# Patient Record
Sex: Female | Born: 1985 | Race: White | Hispanic: No | Marital: Single | Smoking: Never smoker
Health system: Southern US, Community
[De-identification: ages and names within clinical notes are randomized; demographics above are authoritative.]

## PROBLEM LIST (undated history)

## (undated) DIAGNOSIS — J45909 Unspecified asthma, uncomplicated: Secondary | ICD-10-CM

## (undated) DIAGNOSIS — O24419 Gestational diabetes mellitus in pregnancy, unspecified control: Secondary | ICD-10-CM

## (undated) HISTORY — PX: NO PAST SURGERIES: SHX2092

---

## 2006-05-03 ENCOUNTER — Emergency Department: Payer: Self-pay | Admitting: Emergency Medicine

## 2011-09-14 ENCOUNTER — Emergency Department: Payer: Self-pay | Admitting: *Deleted

## 2012-09-07 IMAGING — CR RIGHT HAND - COMPLETE 3+ VIEW
1 series · 3 of 3 positions shown · non-contrast
Comparison: none

REASON FOR EXAM: trauma
COMMENTS:

[Series 1: view not recorded · 0.17mm/px · 3 of 3 slices shown]
[im 1/3]
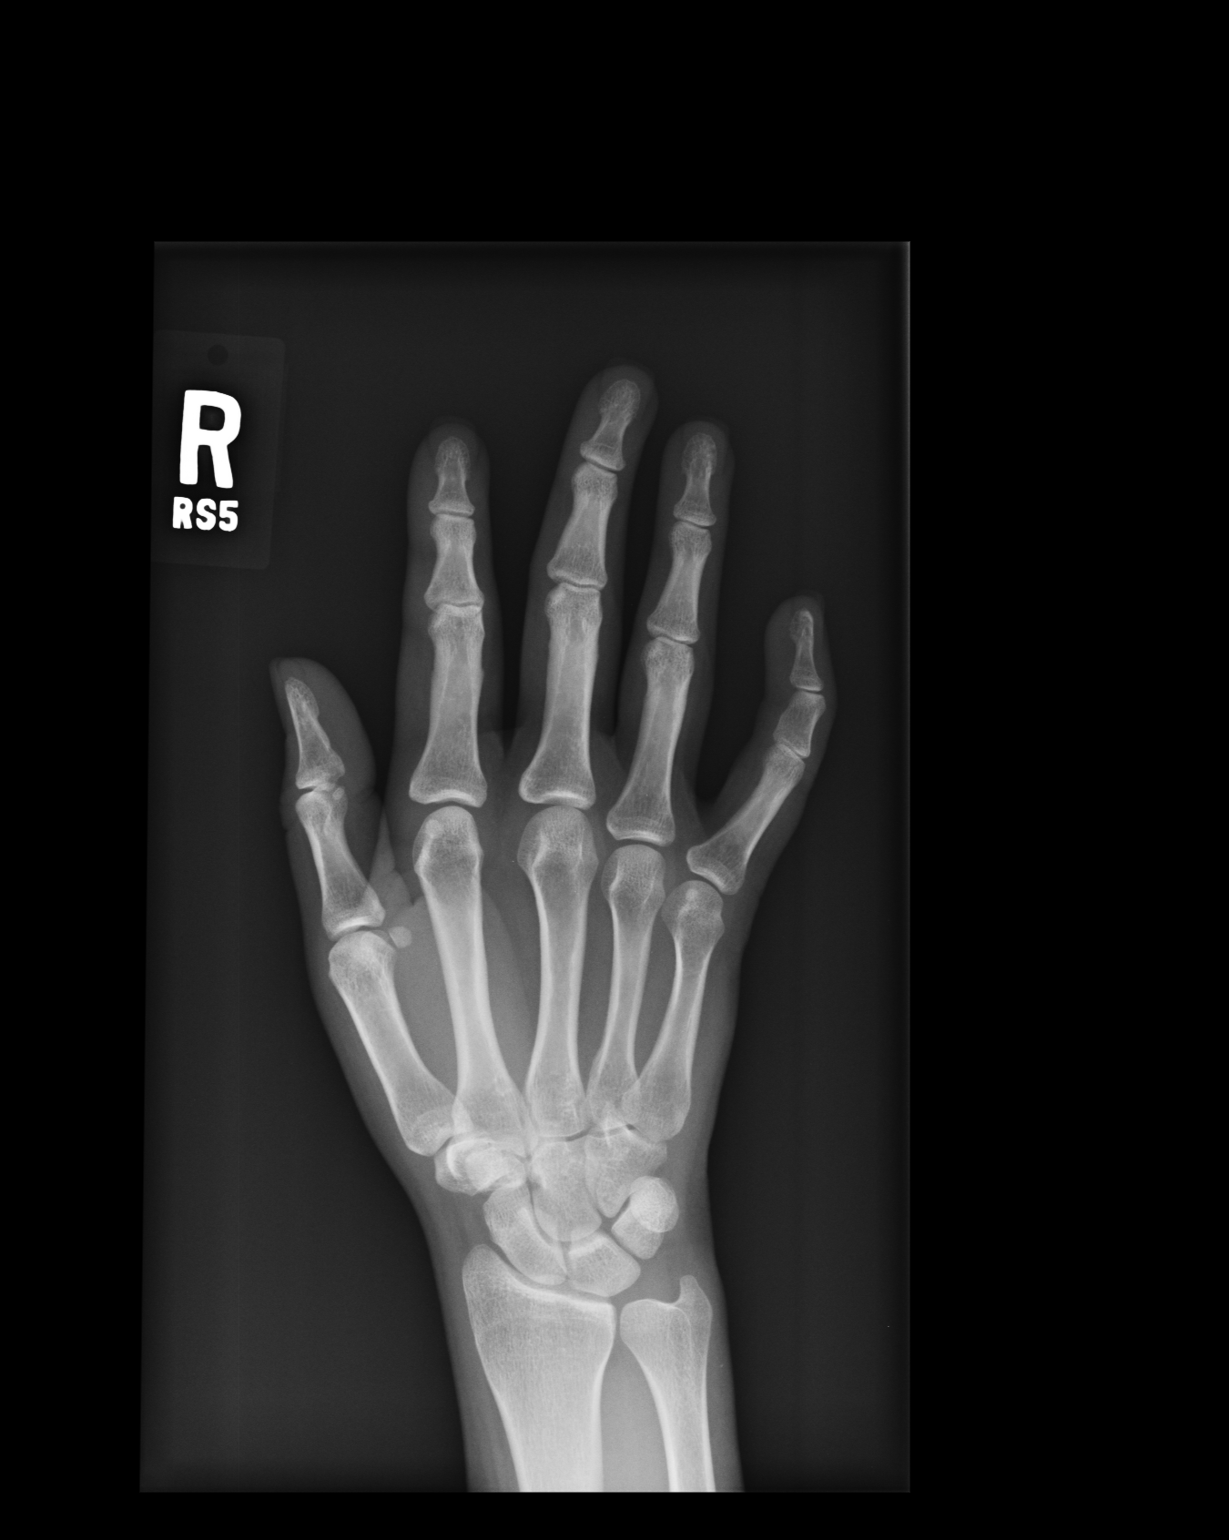
[im 2/3]
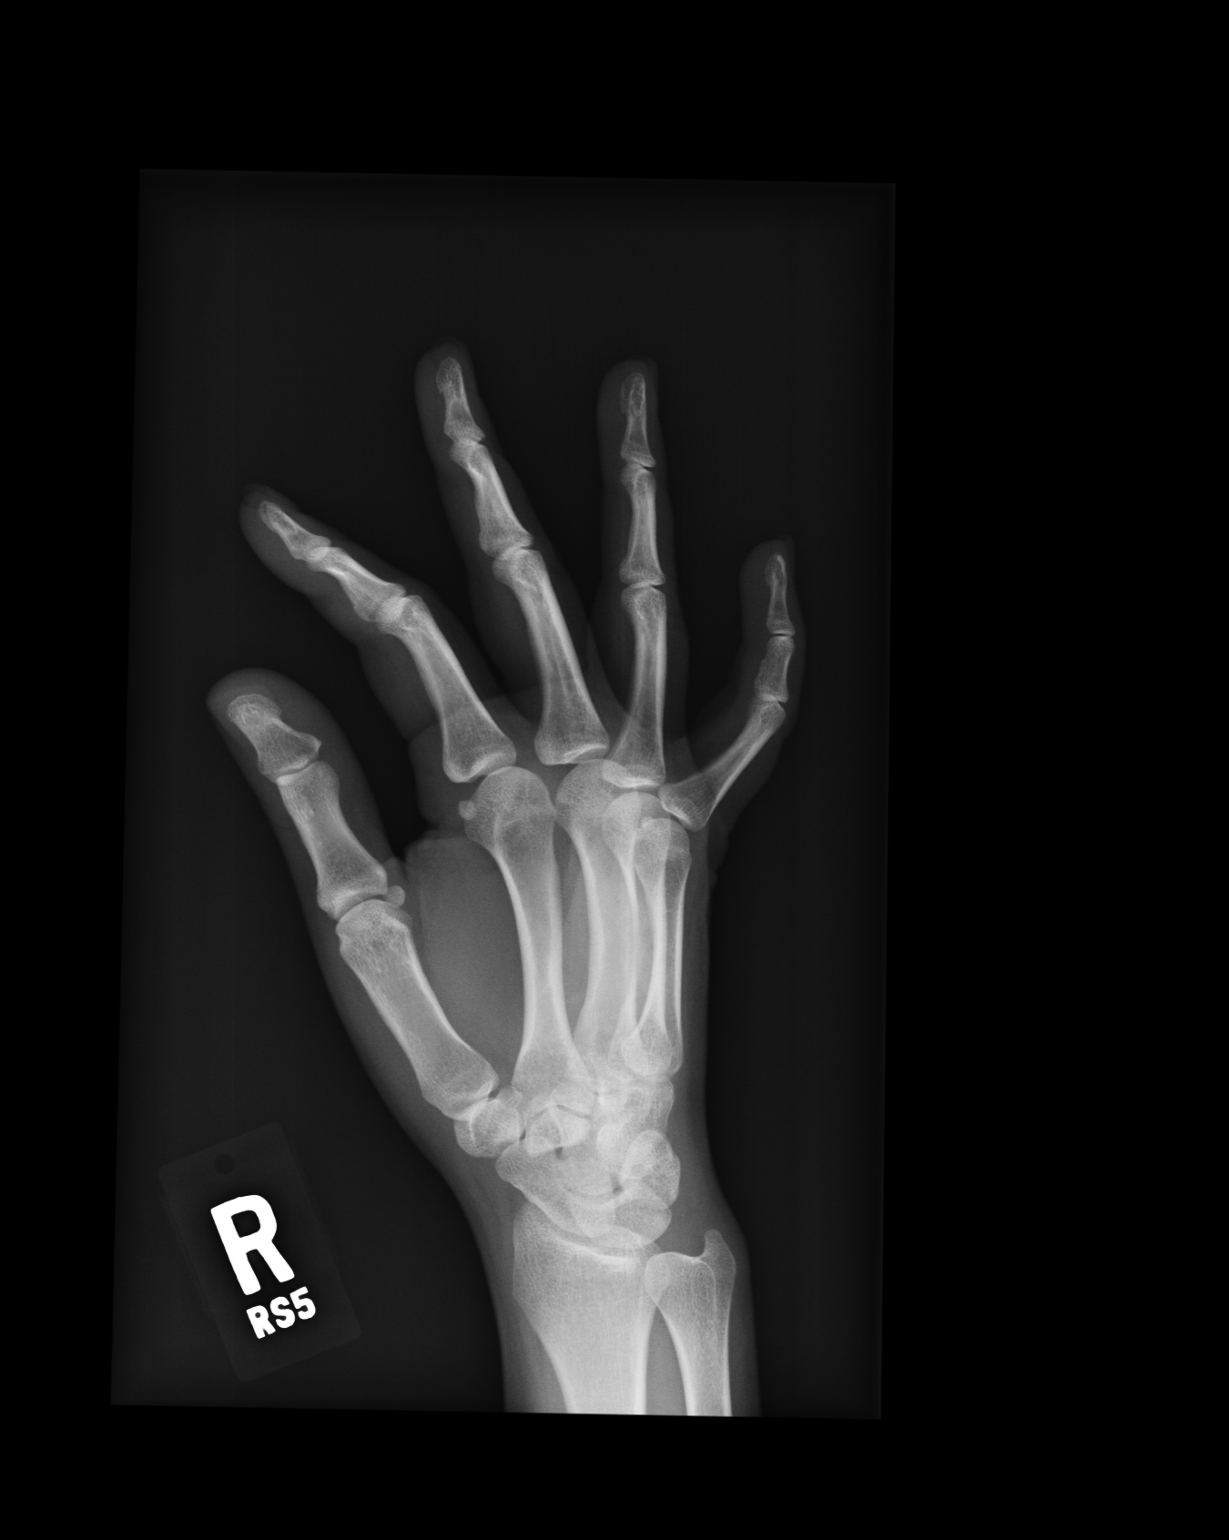
[im 3/3]
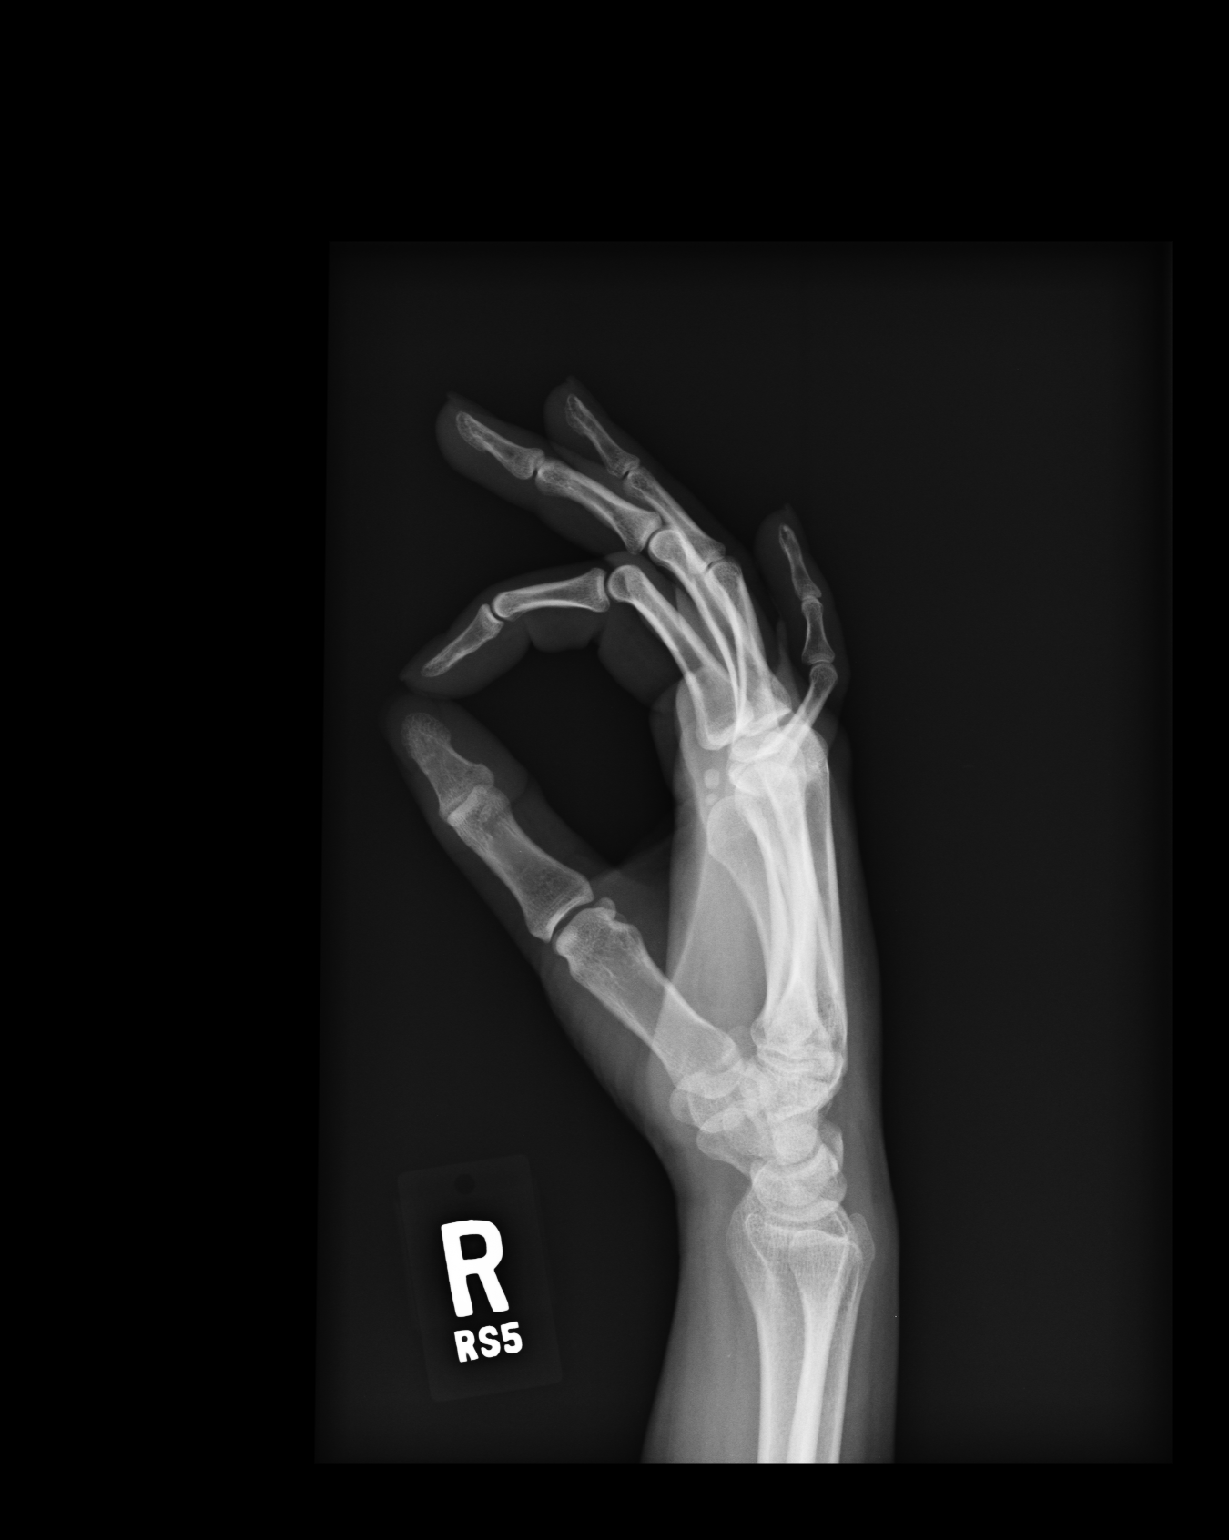

[3 of 3 positions shown; findings below may reference images not displayed]

PROCEDURE:     DXR - DXR HAND RT COMPLETE W/OBLIQUES  - September 14, 2011  [DATE]

RESULT:     No fracture is identified. The proximal phalanx of the fifth
finger is held in extension with respect to the fifth metacarpal. No
associated fracture is seen. The possibility of partial dorsal subluxation
of the proximal phalanx with respect to the fifth metacarpal cannot be
totally excluded radiographically and correlation with clinical findings is
needed.
IMPRESSION: 1. No fracture is seen.
2. There is apparent hyperextension at the MP joint of the fifth finger.
This could be transient but correlation with clinical findings is needed to
exclude partial subluxation.

## 2012-09-07 IMAGING — CT CT CERVICAL SPINE WITHOUT CONTRAST
1 series · 12 of 14 positions shown, 15 images · non-contrast
Comparison: none

REASON FOR EXAM: Trauma, EtOH
COMMENTS:

[Series 3: axial · axial · 0.20mm/px · z∈[-226,-60]mm · 12 of 102 slices shown, 15 images]
[im 8/102  soft-tissue]
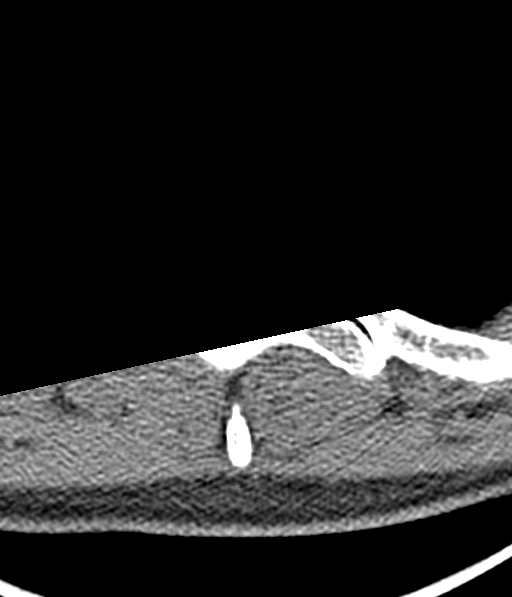
[im 8/102  bone]
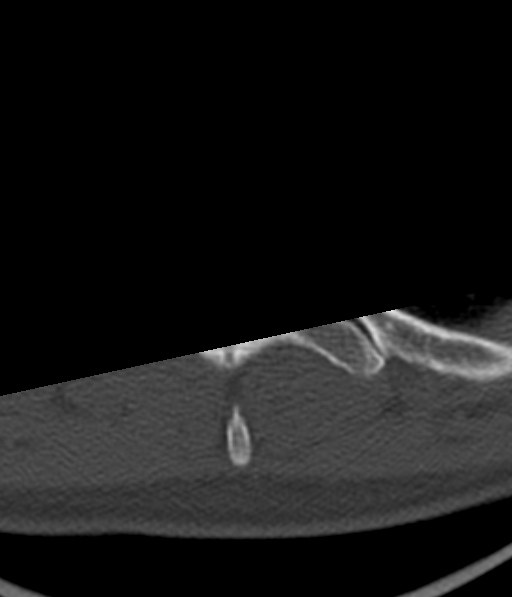
[im 16/102  bone]
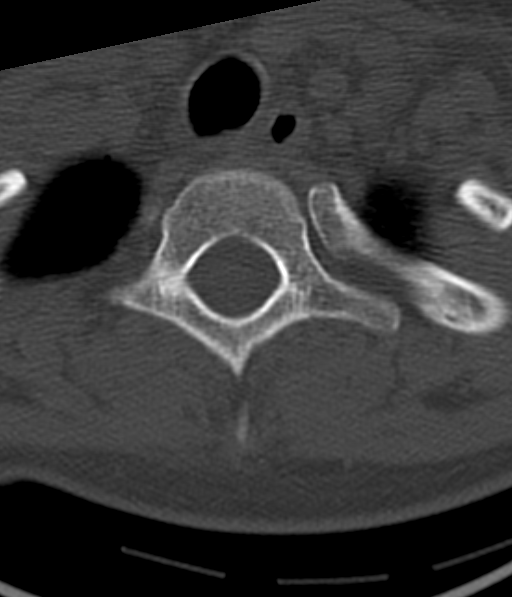
[im 24/102  bone]
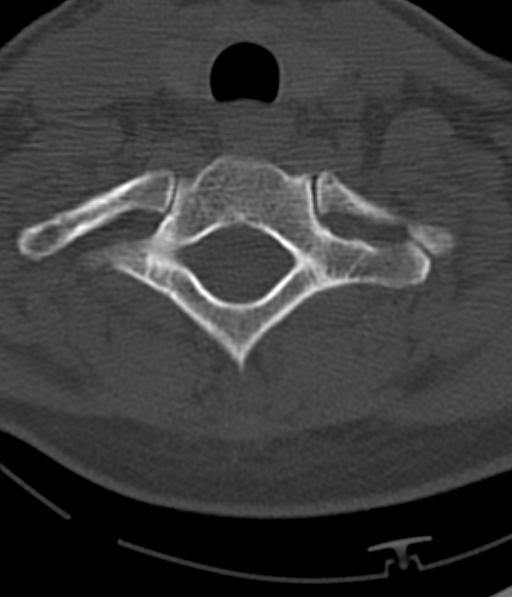
[im 32/102  bone]
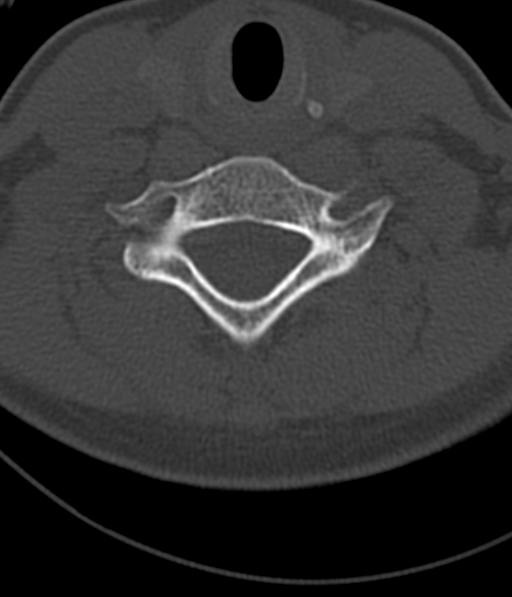
[im 39/102  soft-tissue]
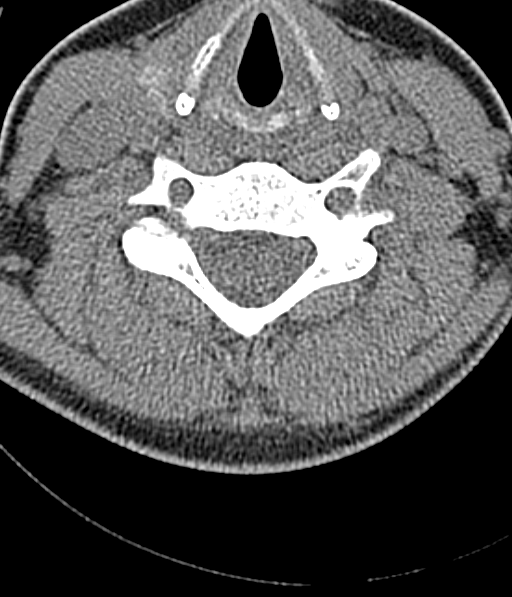
[im 39/102  bone]
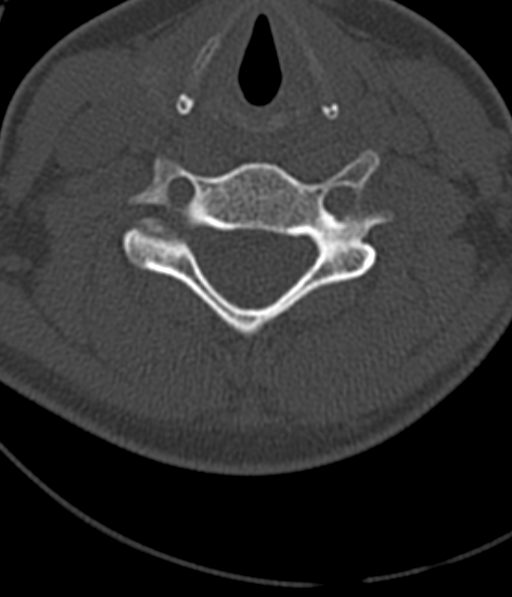
[im 47/102  bone]
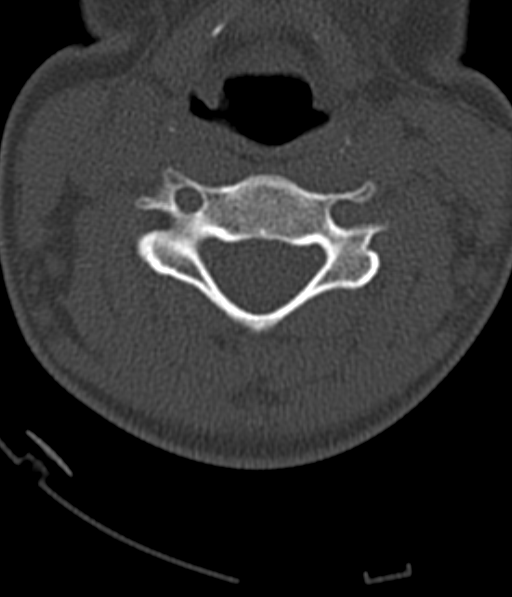
[im 55/102  bone]
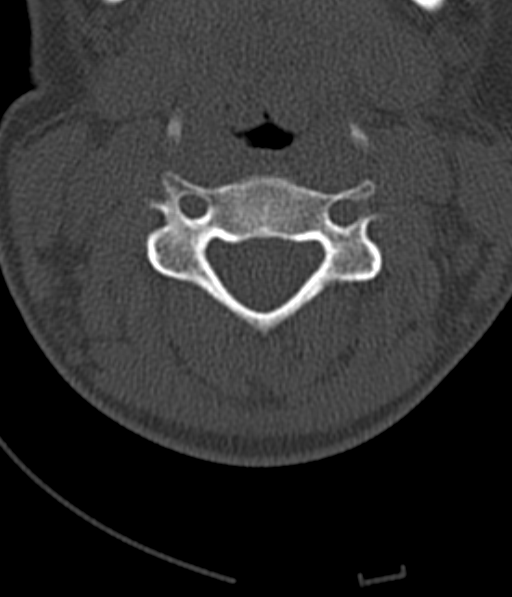
[im 63/102  bone]
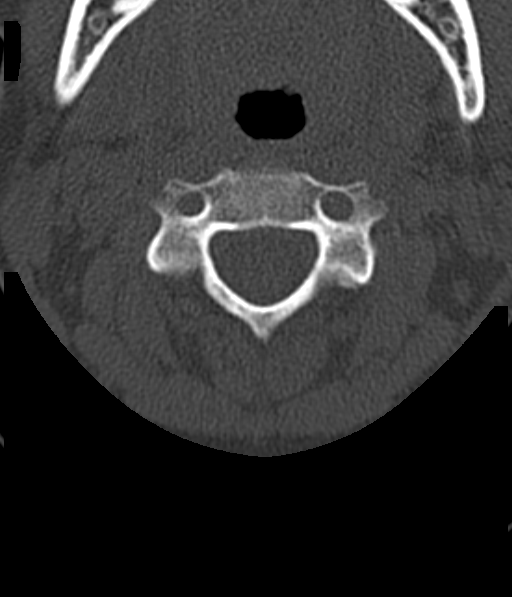
[im 70/102  soft-tissue]
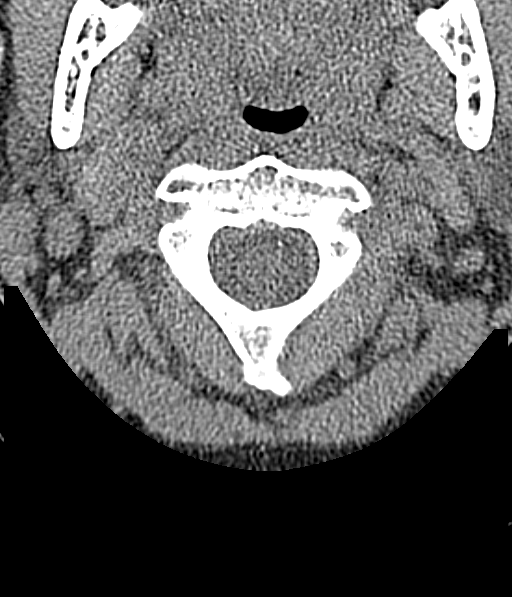
[im 70/102  bone]
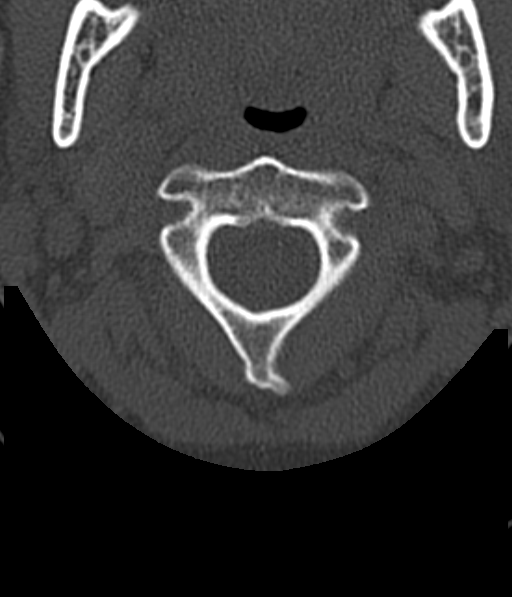
[im 78/102  bone]
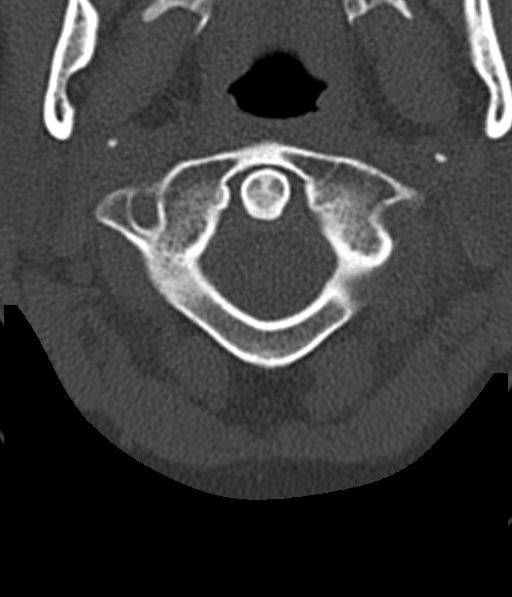
[im 86/102  bone]
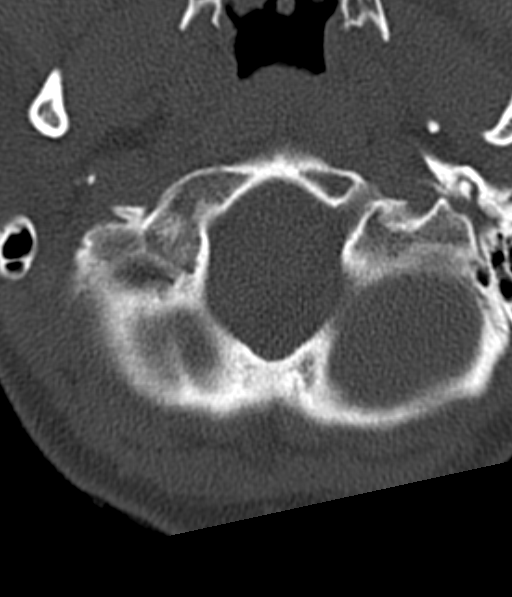
[im 94/102  bone]
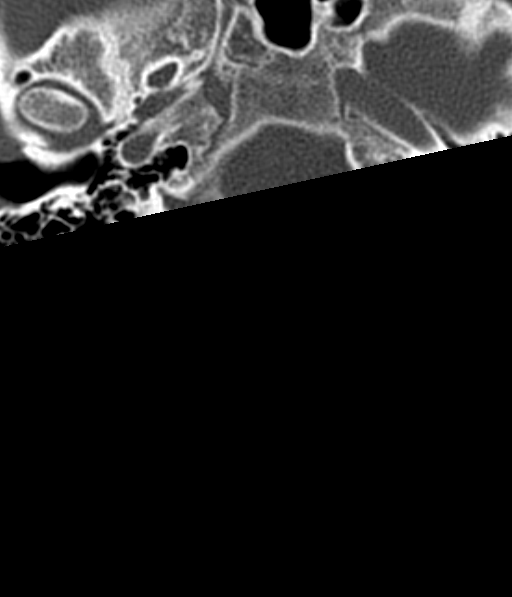

[12 of 14 positions shown; findings below may reference images not displayed]

PROCEDURE:     CT  - CT CERVICAL SPINE WO  - September 14, 2011  [DATE]

RESULT:     Sagittal, axial, and coronal images through the cervical spine
are reviewed.

There is mild reversal of the normal cervical lordosis likely due to muscle
spasm. The cervical vertebral bodies are preserved in height. The
intervertebral disc space heights are well-maintained. There is no evidence
of a jumped facet. The prevertebral soft tissue spaces appear normal. The
spinous processes are intact. The lateral masses of C1 align normally with
those of C2. The odontoid is intact. The bony ring at each cervical level is
intact.
IMPRESSION: 1. I do not see evidence of acute cervical spine fracture nor dislocation.
2. There is reversal of the normal cervical lordosis consistent with muscle
spasm.

## 2013-07-10 ENCOUNTER — Ambulatory Visit: Payer: Self-pay

## 2013-07-30 ENCOUNTER — Ambulatory Visit: Payer: Self-pay

## 2013-09-13 ENCOUNTER — Inpatient Hospital Stay: Payer: Self-pay | Admitting: Obstetrics and Gynecology

## 2013-09-13 LAB — CBC WITH DIFFERENTIAL/PLATELET
Eosinophil #: 0.1 10*3/uL (ref 0.0–0.7)
Eosinophil %: 1.1 %
HCT: 32.8 % — ABNORMAL LOW (ref 35.0–47.0)
HGB: 11.4 g/dL — ABNORMAL LOW (ref 12.0–16.0)
Monocyte %: 6.4 %
Neutrophil #: 7.4 10*3/uL — ABNORMAL HIGH (ref 1.4–6.5)
Neutrophil %: 74.5 %
Platelet: 113 10*3/uL — ABNORMAL LOW (ref 150–440)
RBC: 3.72 10*6/uL — ABNORMAL LOW (ref 3.80–5.20)
RDW: 14.4 % (ref 11.5–14.5)

## 2013-09-14 LAB — PLATELET COUNT: Platelet: 100 10*3/uL — ABNORMAL LOW (ref 150–440)

## 2013-11-01 ENCOUNTER — Ambulatory Visit: Payer: Self-pay | Admitting: Nurse Practitioner

## 2014-11-29 NOTE — L&D Delivery Note (Signed)
Delivery Note At 8:34 AM a viable female was delivered via Vaginal, Spontaneous Delivery (Presentation: LOA).  APGAR: 8, 9; weight  .   Placenta status: intact, Spontaneous.  Cord: 3 vessel with the following complications: nuchal cord x1.  Cord pH: n/a  Anesthesia:  epidrual Episiotomy:  none Lacerations:  none Suture Repair: n/a Est. Blood Loss (mL):  50cc  Mom to postpartum.  Baby to Couplet care / Skin to Skin.  Patient Spontneously ruptured her membranes as she was sitting for her epidural, and progressed quickly after that.  She pushed with two contractions and the baby was delivered via uterine power only.  Nuchal cord x1, reduced at perineum.  Baby was vigorous and placed on mom's chest for recovery with good cry.  Cord kept intact until pulseless and then doubly clamped and cut by FOB.  Placenta delivered spontaneously and intact.  Minimal blood loss.  Ward, Chelsea C 09/30/2015, 8:55 AM

## 2015-02-27 ENCOUNTER — Ambulatory Visit
Admit: 2015-02-27 | Disposition: A | Discharge status: Home / Self Care | Payer: Self-pay | Attending: Family Medicine | Admitting: Family Medicine

## 2015-09-30 ENCOUNTER — Inpatient Hospital Stay: Payer: Medicaid Other | Admitting: Certified Registered"

## 2015-09-30 ENCOUNTER — Other Ambulatory Visit: Payer: Self-pay | Admitting: Obstetrics and Gynecology

## 2015-09-30 ENCOUNTER — Encounter: Payer: Self-pay | Admitting: Certified Nurse Midwife

## 2015-09-30 ENCOUNTER — Inpatient Hospital Stay
Admission: EM | Admit: 2015-09-30 | Discharge: 2015-10-01 | DRG: 775 | Disposition: A | Discharge status: Home / Self Care | Payer: Medicaid Other | Attending: Obstetrics and Gynecology | Admitting: Obstetrics and Gynecology

## 2015-09-30 DIAGNOSIS — O9912 Other diseases of the blood and blood-forming organs and certain disorders involving the immune mechanism complicating childbirth: Principal | ICD-10-CM | POA: Diagnosis present

## 2015-09-30 DIAGNOSIS — Z8632 Personal history of gestational diabetes: Secondary | ICD-10-CM

## 2015-09-30 DIAGNOSIS — O99119 Other diseases of the blood and blood-forming organs and certain disorders involving the immune mechanism complicating pregnancy, unspecified trimester: Secondary | ICD-10-CM

## 2015-09-30 DIAGNOSIS — Z3A40 40 weeks gestation of pregnancy: Secondary | ICD-10-CM

## 2015-09-30 DIAGNOSIS — D696 Thrombocytopenia, unspecified: Secondary | ICD-10-CM | POA: Diagnosis present

## 2015-09-30 HISTORY — DX: Gestational diabetes mellitus in pregnancy, unspecified control: O24.419

## 2015-09-30 HISTORY — DX: Unspecified asthma, uncomplicated: J45.909

## 2015-09-30 LAB — CBC
HCT: 36.8 % (ref 35.0–47.0)
HEMATOCRIT: 37.7 % (ref 35.0–47.0)
HEMOGLOBIN: 13 g/dL (ref 12.0–16.0)
HEMOGLOBIN: 12.5 g/dL (ref 12.0–16.0)
MCH: 31 pg (ref 26.0–34.0)
MCH: 30.5 pg (ref 26.0–34.0)
MCHC: 34.6 g/dL (ref 32.0–36.0)
MCHC: 34 g/dL (ref 32.0–36.0)
MCV: 89.4 fL (ref 80.0–100.0)
MCV: 89.9 fL (ref 80.0–100.0)
PLATELETS: 114 10*3/uL — AB (ref 150–440)
Platelets: 112 10*3/uL — ABNORMAL LOW (ref 150–440)
RBC: 4.21 MIL/uL (ref 3.80–5.20)
RBC: 4.09 MIL/uL (ref 3.80–5.20)
RDW: 14.3 % (ref 11.5–14.5)
RDW: 14.3 % (ref 11.5–14.5)
WBC: 9.6 10*3/uL (ref 3.6–11.0)
WBC: 14.6 10*3/uL — AB (ref 3.6–11.0)

## 2015-09-30 LAB — TYPE AND SCREEN
ABO/RH(D): O POS
Antibody Screen: NEGATIVE

## 2015-09-30 MED ORDER — SODIUM CHLORIDE 0.9 % IJ SOLN: 3.0000 mL | INTRAMUSCULAR | Status: DC | PRN

## 2015-09-30 MED ORDER — FENTANYL 2.5 MCG/ML W/ROPIVACAINE 0.2% IN NS 100 ML EPIDURAL INFUSION (ARMC-ANES): 9.0000 mL/h | EPIDURAL | Status: DC

## 2015-09-30 MED ORDER — MISOPROSTOL 200 MCG PO TABS
ORAL_TABLET | ORAL | Status: AC
  Filled 2015-09-30: qty 4

## 2015-09-30 MED ORDER — LANOLIN HYDROUS EX OINT: TOPICAL_OINTMENT | CUTANEOUS | Status: DC | PRN

## 2015-09-30 MED ORDER — SIMETHICONE 80 MG PO CHEW: 80.0000 mg | CHEWABLE_TABLET | ORAL | Status: DC | PRN

## 2015-09-30 MED ORDER — DIPHENHYDRAMINE HCL 25 MG PO CAPS: 25.0000 mg | ORAL_CAPSULE | ORAL | Status: DC | PRN

## 2015-09-30 MED ORDER — ONDANSETRON HCL 4 MG/2ML IJ SOLN: 4.0000 mg | Freq: Three times a day (TID) | INTRAMUSCULAR | Status: DC | PRN

## 2015-09-30 MED ORDER — LIDOCAINE HCL (PF) 1 % IJ SOLN
30.0000 mL | INTRAMUSCULAR | Status: DC | PRN
  Filled 2015-09-30: qty 30

## 2015-09-30 MED ORDER — BUTORPHANOL TARTRATE 1 MG/ML IJ SOLN
2.0000 mg | INTRAMUSCULAR | Status: DC | PRN
  Administered 2015-09-30: 1 mg via INTRAVENOUS

## 2015-09-30 MED ORDER — ONDANSETRON HCL 4 MG/2ML IJ SOLN: 4.0000 mg | INTRAMUSCULAR | Status: DC | PRN

## 2015-09-30 MED ORDER — LIDOCAINE-EPINEPHRINE (PF) 1.5 %-1:200000 IJ SOLN
INTRAMUSCULAR | Status: DC | PRN
  Administered 2015-09-30: 3 mL via EPIDURAL

## 2015-09-30 MED ORDER — BUTORPHANOL TARTRATE 1 MG/ML IJ SOLN
INTRAMUSCULAR | Status: AC
  Administered 2015-09-30: 1 mg via INTRAVENOUS
  Filled 2015-09-30: qty 1

## 2015-09-30 MED ORDER — AMMONIA AROMATIC IN INHA: 0.3000 mL | Freq: Once | RESPIRATORY_TRACT | Status: DC | PRN

## 2015-09-30 MED ORDER — DOCUSATE SODIUM 100 MG PO CAPS
100.0000 mg | ORAL_CAPSULE | Freq: Two times a day (BID) | ORAL | Status: DC
Start: 2015-09-30 — End: 2015-10-01
  Administered 2015-09-30 – 2015-10-01 (×2): 100 mg via ORAL
  Filled 2015-09-30 (×2): qty 1

## 2015-09-30 MED ORDER — DIPHENHYDRAMINE HCL 25 MG PO CAPS: 25.0000 mg | ORAL_CAPSULE | Freq: Four times a day (QID) | ORAL | Status: DC | PRN

## 2015-09-30 MED ORDER — FENTANYL 2.5 MCG/ML W/ROPIVACAINE 0.2% IN NS 100 ML EPIDURAL INFUSION (ARMC-ANES)
EPIDURAL | Status: AC
  Administered 2015-09-30: 9 mL/h via EPIDURAL
  Filled 2015-09-30: qty 100

## 2015-09-30 MED ORDER — MEPERIDINE HCL 25 MG/ML IJ SOLN: 6.2500 mg | INTRAMUSCULAR | Status: DC | PRN

## 2015-09-30 MED ORDER — ONDANSETRON HCL 4 MG/2ML IJ SOLN: 4.0000 mg | Freq: Four times a day (QID) | INTRAMUSCULAR | Status: DC | PRN

## 2015-09-30 MED ORDER — NALBUPHINE HCL 10 MG/ML IJ SOLN
5.0000 mg | Freq: Once | INTRAMUSCULAR | Status: DC | PRN
  Filled 2015-09-30: qty 0.5

## 2015-09-30 MED ORDER — ACETAMINOPHEN 325 MG PO TABS: 650.0000 mg | ORAL_TABLET | ORAL | Status: DC | PRN

## 2015-09-30 MED ORDER — NALOXONE HCL 2 MG/2ML IJ SOSY
1.0000 ug/kg/h | PREFILLED_SYRINGE | INTRAVENOUS | Status: DC | PRN
  Filled 2015-09-30: qty 2

## 2015-09-30 MED ORDER — OXYCODONE-ACETAMINOPHEN 5-325 MG PO TABS
1.0000 | ORAL_TABLET | ORAL | Status: DC | PRN
  Administered 2015-09-30 – 2015-10-01 (×5): 1 via ORAL
  Filled 2015-09-30 (×6): qty 1

## 2015-09-30 MED ORDER — KETOROLAC TROMETHAMINE 30 MG/ML IJ SOLN: 30.0000 mg | Freq: Four times a day (QID) | INTRAMUSCULAR | Status: DC | PRN

## 2015-09-30 MED ORDER — NALBUPHINE HCL 10 MG/ML IJ SOLN
5.0000 mg | INTRAMUSCULAR | Status: DC | PRN
  Filled 2015-09-30: qty 0.5

## 2015-09-30 MED ORDER — TETANUS-DIPHTH-ACELL PERTUSSIS 5-2.5-18.5 LF-MCG/0.5 IM SUSP: 0.5000 mL | Freq: Once | INTRAMUSCULAR | Status: DC

## 2015-09-30 MED ORDER — BUPIVACAINE HCL (PF) 0.25 % IJ SOLN
INTRAMUSCULAR | Status: DC | PRN
  Administered 2015-09-30: 5 mL via EPIDURAL

## 2015-09-30 MED ORDER — LIDOCAINE HCL (PF) 1 % IJ SOLN
INTRAMUSCULAR | Status: AC
  Filled 2015-09-30: qty 30

## 2015-09-30 MED ORDER — ONDANSETRON HCL 4 MG PO TABS: 4.0000 mg | ORAL_TABLET | ORAL | Status: DC | PRN

## 2015-09-30 MED ORDER — WITCH HAZEL-GLYCERIN EX PADS: 1.0000 "application " | MEDICATED_PAD | CUTANEOUS | Status: DC | PRN

## 2015-09-30 MED ORDER — OXYTOCIN 40 UNITS IN LACTATED RINGERS INFUSION - SIMPLE MED: 62.5000 mL/h | INTRAVENOUS | Status: DC | PRN

## 2015-09-30 MED ORDER — AMMONIA AROMATIC IN INHA
RESPIRATORY_TRACT | Status: AC
  Filled 2015-09-30: qty 10

## 2015-09-30 MED ORDER — IBUPROFEN 600 MG PO TABS
600.0000 mg | ORAL_TABLET | Freq: Four times a day (QID) | ORAL | Status: DC
  Administered 2015-09-30 – 2015-10-01 (×4): 600 mg via ORAL
  Filled 2015-09-30 (×5): qty 1

## 2015-09-30 MED ORDER — INFLUENZA VAC SPLIT QUAD 0.5 ML IM SUSY
0.5000 mL | PREFILLED_SYRINGE | INTRAMUSCULAR | Status: AC | PRN
  Administered 2015-10-01: 0.5 mL via INTRAMUSCULAR
  Filled 2015-09-30: qty 0.5

## 2015-09-30 MED ORDER — KETOROLAC TROMETHAMINE 30 MG/ML IJ SOLN: 30.0000 mg | Freq: Four times a day (QID) | INTRAMUSCULAR | Status: AC | PRN

## 2015-09-30 MED ORDER — OXYTOCIN 40 UNITS IN LACTATED RINGERS INFUSION - SIMPLE MED
62.5000 mL/h | INTRAVENOUS | Status: DC
  Filled 2015-09-30: qty 1000

## 2015-09-30 MED ORDER — PRENATAL MULTIVITAMIN CH
1.0000 | ORAL_TABLET | Freq: Every day | ORAL | Status: DC
  Administered 2015-10-01: 1 via ORAL
  Filled 2015-09-30: qty 1

## 2015-09-30 MED ORDER — NALOXONE HCL 0.4 MG/ML IJ SOLN: 0.4000 mg | INTRAMUSCULAR | Status: DC | PRN

## 2015-09-30 MED ORDER — DIPHENHYDRAMINE HCL 50 MG/ML IJ SOLN: 12.5000 mg | INTRAMUSCULAR | Status: DC | PRN

## 2015-09-30 MED ORDER — LACTATED RINGERS IV SOLN: 500.0000 mL | INTRAVENOUS | Status: DC | PRN

## 2015-09-30 MED ORDER — OXYTOCIN 10 UNIT/ML IJ SOLN
INTRAMUSCULAR | Status: AC
  Filled 2015-09-30: qty 2

## 2015-09-30 MED ORDER — MISOPROSTOL 200 MCG PO TABS
800.0000 ug | ORAL_TABLET | Freq: Once | ORAL | Status: DC | PRN
  Filled 2015-09-30: qty 4

## 2015-09-30 MED ORDER — DIBUCAINE 1 % RE OINT: 1.0000 "application " | TOPICAL_OINTMENT | RECTAL | Status: DC | PRN

## 2015-09-30 MED ORDER — LACTATED RINGERS IV SOLN
INTRAVENOUS | Status: DC
  Administered 2015-09-30: 06:00:00 via INTRAVENOUS

## 2015-09-30 MED ORDER — OXYTOCIN BOLUS FROM INFUSION
500.0000 mL | INTRAVENOUS | Status: DC
  Administered 2015-09-30: 500 mL via INTRAVENOUS

## 2015-09-30 NOTE — H&P (Signed)
OB History & Physical   History of Present Illness:  Chief Complaint:  Complains of onset contractions last night at [facility]6PM, becoming more intense at 3AM this morning HPI:  Gina Salenshley N Chopra is a 29 y.o. G2 21P1001 female with EDC=09/30/2015  at 40 weeks dated by LMP 12/24/2014=9wk2d ultrasound.  Her pregnancy has been complicated by thrombocytopenia with the most recent platelet count 10/9=110K. .  She presents to L&D for evaluation of labor. Had onset of contractions last night at [facility]6PM, but they became more intense and frequent at 3AM this morning. No VB or LOF. ROS positive for nasal congestion and cough. Prenatal care site: [facility] has also been remarkable for negative first trimester screen, a history of GDMA1 with first pregnancy, and asthma. She received TDAP on 8/24. Desires to bottle feed. Vasectomy planned for contraception.      Maternal Medical History:   Past Medical History  Diagnosis Date  . Asthma   . Gestational diabetes     G1    Past Surgical History  Procedure Laterality Date  . No past surgeries      No Known Allergies       Current medications: Prenatal vitamins daily, Tylenol 325 mgm prn, Claritin 10 mgm daily prn, Albuterol inhaler prn       Social History: She  reports that she has never smoked. She does not have any smokeless tobacco history on file. She reports that she does not drink alcohol or use illicit drugs.  Family History: family history is not on file.   Review of Systems: Negative x 10 systems reviewed except as noted in the HPI.      Physical Exam:  Vital Signs: BP 123/71 mmHg  Pulse 81  Temp(Src) 97.4 F (36.3 C) (Oral)  Resp 16  Ht 5\' 4"  (1.626 m)  Wt 180 lb (81.647 kg)  BMI 30.88 kg/m2  LMP 12/24/2014 General: no acute distress.  HEENT: normocephalic, atraumatic Heart: regular rate & rhythm.  No murmurs/rubs/gallops Lungs: clear to auscultation bilaterally Abdomen: soft, gravid, non-tender;   EFW:7.5# Pelvic:   External: Normal external female genitalia  Cervix: Dilation: 4 / Effacement (%): 80 / Station: -2 /cephalic on Sara LeeLeopold's     Extremities: non-tender, symmetric, no edema bilaterally.  DTRs: +1 Neurologic: Alert & oriented x 3.    Pertinent Results:  Prenatal Labs: Blood type/Rh O positive  Antibody screen negative  Rubella Varicella Immune Nonimmune  RPR Non reactive  HBsAg negative  HIV negative  GC negative  Chlamydia negative  Genetic screening First trimester test negative  1 hour GTT 128/ 130  3 hour GTT NA  GBS negative on 10/6   Baseline FHR: 130 baseline with accels to 140s, moderate variability Toco: contractions q2-5 min apart  Assessment:  Gina Powers is a 29 y.o. G2 P1001 at 40 weeks in labor Thrombocytopenia this pregnancy  Cat1 FHR tracing  Plan:  1. Admit to Labor & Delivery -monitor progress and fetal well being  2. CBC, T&S, Clrs, IVF 3. GBS negative.   4. Consents obtained. 5. Stadol for pain, epidural if plt >100K  Stanford Strauch  09/30/2015 7:32 AM

## 2015-09-30 NOTE — Anesthesia Preprocedure Evaluation (Signed)
Anesthesia Evaluation  Patient identified by MRN, date of birth, ID band Patient awake    Reviewed: Allergy & Precautions, H&P , NPO status , Patient's Chart, lab work & pertinent test results  History of Anesthesia Complications Negative for: history of anesthetic complications  Airway Mallampati: I  TM Distance: >3 FB Neck ROM: full    Dental no notable dental hx.    Pulmonary asthma ,    Pulmonary exam normal        Cardiovascular negative cardio ROS Normal cardiovascular exam     Neuro/Psych negative neurological ROS  negative psych ROS   GI/Hepatic negative GI ROS, Neg liver ROS,   Endo/Other  diabetes, Gestational  Renal/GU negative Renal ROS  negative genitourinary   Musculoskeletal   Abdominal   Peds  Hematology  (+) Blood dyscrasia, ,   Anesthesia Other Findings   Reproductive/Obstetrics (+) Pregnancy                             Anesthesia Physical Anesthesia Plan  ASA: II  Anesthesia Plan: Epidural   Post-op Pain Management:    Induction:   Airway Management Planned:   Additional Equipment:   Intra-op Plan:   Post-operative Plan:   Informed Consent: I have reviewed the patients History and Physical, chart, labs and discussed the procedure including the risks, benefits and alternatives for the proposed anesthesia with the patient or authorized representative who has indicated his/her understanding and acceptance.     Plan Discussed with: CRNA  Anesthesia Plan Comments:         Anesthesia Quick Evaluation

## 2015-09-30 NOTE — Discharge Instructions (Signed)
Discharge instructions:   Call your doctor for increased pain or vaginal bleeding, temperature above 100.4, depression, or concerns.  No strenuous activity or heavy lifting for 6 weeks.  No intercourse, tampons, douching, or enemas for 6 weeks.  No tub baths-showers only.  No driving for 2 weeks or while taking pain medications.  Continue prenatal vitamin and iron.  Increase calories and fluids while breastfeeding.  Call pediatrician for concerns about your baby! [facility] for concerns about breastfeeding.

## 2015-09-30 NOTE — Anesthesia Procedure Notes (Signed)
Epidural Patient location during procedure: OB Start time: 09/30/2015 7:30 AM End time: 09/30/2015 7:50 AM  Staffing Anesthesiologist: Lenard SimmerKARENZ, ANDREW Resident/CRNA: Mathews ArgyleLOGAN, Mathilde Mcwherter Performed by: resident/CRNA   Preanesthetic Checklist Completed: patient identified, site marked, surgical consent, pre-op evaluation, timeout performed, IV checked, risks and benefits discussed and monitors and equipment checked  Epidural Patient position: sitting Prep: Betadine and site prepped and draped Patient monitoring: heart rate, continuous pulse ox and blood pressure Approach: midline Location: L3-L4 Injection technique: LOR saline  Needle:  Needle type: Tuohy  Needle gauge: 18 G Needle length: 9 cm Needle insertion depth: 6 cm Catheter type: closed end flexible Catheter size: 20 Guage Catheter at skin depth: 11 cm Test dose: negative and 1.5% lidocaine with Epi 1:200 K  Assessment Events: blood not aspirated, injection not painful, no injection resistance, negative IV test and no paresthesia  Additional Notes   Patient tolerated the insertion well without complications.Reason for block:procedure for pain

## 2015-09-30 NOTE — Discharge Summary (Signed)
Obstetrical Discharge Summary  Patient Name: Gina Powers DOB: 09/11/1986 MRN: [id number]  Date of Admission: 09/30/2015 Date of Discharge: 10/01/2015  Primary OB: [facility]  Gestational Age at Delivery: 782w0d   Antepartum complications: thrombocytopenia Admitting Diagnosis: labor Secondary Diagnosis: thrombocytopenia Augmentation: none Complications: None Intrapartum complications/course: Patient presented in spontaneous labor with resultant spontaneous vaginal delivery. Date of Delivery: 09-30-15 Delivered By: Ranae Plumberhelsea Ward, MD Delivery Type: spontaneous vaginal delivery Anesthesia: epidural Placenta: sponatneous Laceration: none Episiotomy: none Newborn Data: Live born female  Birth Weight:   APGAR: 8, 9   Discharge Physical Exam:  BP 113/64 mmHg  Pulse 65  Temp(Src) 98 F (36.7 C) (Oral)  Resp 20  Ht 5\' 4"  (1.626 m)  Wt 180 lb (81.647 kg)  BMI 30.88 kg/m2  SpO2 98%  LMP 12/24/2014  Breastfeeding? Unknown General: NAD CV: RRR Pulm: CTABL, nl effort ABD: s/nd/nt, fundus firm and below the umbilicus Lochia: moderate DVT Evaluation: LE non-ttp, no evidence of DVT on exam.  HEMOGLOBIN  Date Value Ref Range Status  10/01/2015 11.4* 12.0 - 16.0 g/dL Final   HGB  Date Value Ref Range Status  09/13/2013 11.4* 12.0-16.0 g/dL Final   HCT  Date Value Ref Range Status  10/01/2015 33.0* 35.0 - 47.0 % Final  09/16/2013 30.0* 35.0-47.0 % Final   Recent Labs     09/30/15  1236  10/01/15  0522  PLT  114*  102*    Post partum course: no complications.  On postpartum day #1 was meeting discharge criteria. She was ambulating, voiding spontaneously, tolerating and oral diet, and her pain was well controlled on PO pain meds.  Her lochia was within normal limits. She received the influenza vaccination and the varicella vaccination prior to discharge. Postpartum Procedures: flu and varicella vaccines  Disposition: stable, discharge to home. Baby Feeding: formula Baby  Disposition: home with mom  Rh Immune globulin given: no Rubella vaccine given: no Tdap vaccine given in AP or PP setting: antepartum Flu vaccine given in AP or PP setting: postpartum  Contraception: vasectomy  Prenatal Labs:  Blood type/Rh O positive  Antibody screen negative  Rubella Varicella Immune Nonimmune  RPR Non reactive  HBsAg negative  HIV negative  GC negative  Chlamydia negative  Genetic screening First trimester test negative  1 hour GTT 128/ 130  3 hour GTT NA  GBS negative on 10/6         Plan:  Gina Powers was discharged to home in good condition. [facility] with Dr Elesa MassedWard in 6 weeks   Discharge Medications:   Medication List    TAKE these medications        acetaminophen 325 MG tablet  Commonly known as:  TYLENOL  Take 650 mg by mouth every 6 (six) hours as needed.     albuterol 108 (90 BASE) MCG/ACT inhaler  Commonly known as:  PROVENTIL HFA;VENTOLIN HFA  Inhale 2 puffs into the lungs every 6 (six) hours as needed for wheezing or shortness of breath.     ibuprofen 600 MG tablet  Commonly known as:  ADVIL,MOTRIN  Take 1 tablet (600 mg total) by mouth every 6 (six) hours.     loratadine 10 MG tablet  Commonly known as:  CLARITIN  Take 10 mg by mouth daily.     prenatal multivitamin Tabs tablet  Take 1 tablet by mouth daily at 12 noon.        Signed: Thomasene MohairStephen Salsabeel Gorelick, MD 10/01/2015 10:39 AM

## 2015-10-01 DIAGNOSIS — D696 Thrombocytopenia, unspecified: Secondary | ICD-10-CM

## 2015-10-01 DIAGNOSIS — O99119 Other diseases of the blood and blood-forming organs and certain disorders involving the immune mechanism complicating pregnancy, unspecified trimester: Secondary | ICD-10-CM

## 2015-10-01 LAB — CBC
HCT: 33 % — ABNORMAL LOW (ref 35.0–47.0)
Hemoglobin: 11.4 g/dL — ABNORMAL LOW (ref 12.0–16.0)
MCH: 31.4 pg (ref 26.0–34.0)
MCHC: 34.6 g/dL (ref 32.0–36.0)
MCV: 90.6 fL (ref 80.0–100.0)
PLATELETS: 102 10*3/uL — AB (ref 150–440)
RBC: 3.64 MIL/uL — AB (ref 3.80–5.20)
RDW: 14.3 % (ref 11.5–14.5)
WBC: 10.2 10*3/uL (ref 3.6–11.0)

## 2015-10-01 LAB — RPR: RPR Ser Ql: NONREACTIVE

## 2015-10-01 MED ORDER — VARICELLA VIRUS VACCINE LIVE 1350 PFU/0.5ML IJ SUSR
0.5000 mL | INTRAMUSCULAR | Status: AC | PRN
  Administered 2015-10-01: 0.5 mL via SUBCUTANEOUS
  Filled 2015-10-01: qty 0.5

## 2015-10-01 MED ORDER — IBUPROFEN 600 MG PO TABS: 600.0000 mg | ORAL_TABLET | Freq: Four times a day (QID) | ORAL | Status: AC

## 2015-10-01 NOTE — Progress Notes (Signed)
Discharge instructions reviewed with pt. Pt v/u of all instructions. ID bands of mom and infant matched. Prescription given to pt. Escorted by auxillary via w/c in stable condition. Ruta HindsKelly Esco Joslyn, RN 10/01/15 1310

## 2015-10-01 NOTE — Anesthesia Postprocedure Evaluation (Signed)
  Anesthesia Post-op Note  Patient: Gina Powers  Procedure(s) Performed: * No procedures listed *  Anesthesia type:No value filed.  Patient location: 351  Post pain: Pain level controlled  Post assessment: Post-op Vital signs reviewed, Patient's Cardiovascular Status Stable, Respiratory Function Stable, Patent Airway and No signs of Nausea or vomiting  Post vital signs: Reviewed and stable  Last Vitals:  Filed Vitals:   10/01/15 0445  BP: 107/62  Pulse: 81  Temp: 37.1 C  Resp: 18    Level of consciousness: awake, alert  and patient cooperative  Complications: No apparent anesthesia complications

## 2022-01-20 DIAGNOSIS — J069 Acute upper respiratory infection, unspecified: Secondary | ICD-10-CM | POA: Diagnosis not present

## 2022-01-20 DIAGNOSIS — Z6830 Body mass index (BMI) 30.0-30.9, adult: Secondary | ICD-10-CM | POA: Diagnosis not present

## 2022-10-13 DIAGNOSIS — J020 Streptococcal pharyngitis: Secondary | ICD-10-CM | POA: Diagnosis not present

## 2022-11-02 DIAGNOSIS — J019 Acute sinusitis, unspecified: Secondary | ICD-10-CM | POA: Diagnosis not present

## 2022-11-02 DIAGNOSIS — J029 Acute pharyngitis, unspecified: Secondary | ICD-10-CM | POA: Diagnosis not present

## 2022-11-21 DIAGNOSIS — Z87891 Personal history of nicotine dependence: Secondary | ICD-10-CM | POA: Diagnosis not present

## 2022-11-21 DIAGNOSIS — J029 Acute pharyngitis, unspecified: Secondary | ICD-10-CM | POA: Diagnosis not present

## 2022-11-23 DIAGNOSIS — R6884 Jaw pain: Secondary | ICD-10-CM | POA: Diagnosis not present

## 2022-11-23 DIAGNOSIS — J351 Hypertrophy of tonsils: Secondary | ICD-10-CM | POA: Diagnosis not present

## 2022-11-23 DIAGNOSIS — J039 Acute tonsillitis, unspecified: Secondary | ICD-10-CM | POA: Diagnosis not present

## 2022-11-23 DIAGNOSIS — Z87891 Personal history of nicotine dependence: Secondary | ICD-10-CM | POA: Diagnosis not present

## 2022-12-02 DIAGNOSIS — J039 Acute tonsillitis, unspecified: Secondary | ICD-10-CM | POA: Diagnosis not present

## 2023-01-21 DIAGNOSIS — R69 Illness, unspecified: Secondary | ICD-10-CM | POA: Diagnosis not present

## 2023-01-21 DIAGNOSIS — J312 Chronic pharyngitis: Secondary | ICD-10-CM | POA: Diagnosis not present

## 2023-01-21 DIAGNOSIS — Z0001 Encounter for general adult medical examination with abnormal findings: Secondary | ICD-10-CM | POA: Diagnosis not present
